# Patient Record
Sex: Female | Born: 1950 | Race: White | Hispanic: No | Marital: Married
Health system: Southern US, Community
[De-identification: ages and names within clinical notes are randomized; demographics above are authoritative.]

## PROBLEM LIST (undated history)

## (undated) DIAGNOSIS — F319 Bipolar disorder, unspecified: Secondary | ICD-10-CM

## (undated) DIAGNOSIS — E119 Type 2 diabetes mellitus without complications: Secondary | ICD-10-CM

## (undated) DIAGNOSIS — K219 Gastro-esophageal reflux disease without esophagitis: Secondary | ICD-10-CM

## (undated) HISTORY — DX: Type 2 diabetes mellitus without complications: E11.9

## (undated) HISTORY — DX: Gastro-esophageal reflux disease without esophagitis: K21.9

## (undated) HISTORY — DX: Bipolar disorder, unspecified: F31.9

---

## 2021-04-09 ENCOUNTER — Encounter (HOSPITAL_COMMUNITY): Payer: Self-pay | Admitting: Emergency Medicine

## 2021-04-09 ENCOUNTER — Emergency Department (HOSPITAL_COMMUNITY)
Admission: EM | Admit: 2021-04-09 | Discharge: 2021-04-10 | Disposition: A | Discharge status: Home / Self Care | Payer: Medicare Other | Attending: Emergency Medicine | Admitting: Emergency Medicine

## 2021-04-09 ENCOUNTER — Emergency Department (HOSPITAL_BASED_OUTPATIENT_CLINIC_OR_DEPARTMENT_OTHER): Payer: Medicare Other

## 2021-04-09 DIAGNOSIS — R0602 Shortness of breath: Secondary | ICD-10-CM | POA: Diagnosis not present

## 2021-04-09 DIAGNOSIS — M79605 Pain in left leg: Secondary | ICD-10-CM

## 2021-04-09 DIAGNOSIS — E119 Type 2 diabetes mellitus without complications: Secondary | ICD-10-CM | POA: Diagnosis not present

## 2021-04-09 DIAGNOSIS — R2242 Localized swelling, mass and lump, left lower limb: Secondary | ICD-10-CM | POA: Diagnosis not present

## 2021-04-09 DIAGNOSIS — Z20822 Contact with and (suspected) exposure to covid-19: Secondary | ICD-10-CM | POA: Insufficient documentation

## 2021-04-09 DIAGNOSIS — R609 Edema, unspecified: Secondary | ICD-10-CM

## 2021-04-09 NOTE — Progress Notes (Signed)
VASCULAR LAB    Left lower extremity venous duplex has been performed.  See CV proc for preliminary results.  Gave verbal report to triage nurse  Sherren Kerns, RVT 04/09/2021, 5:39 PM

## 2021-04-09 NOTE — ED Provider Notes (Signed)
Emergency Medicine Provider Triage Evaluation Note  Krista Roth , a 70 y.o. female  was evaluated in triage.  Pt complains of edema. Sent from [facility]. States she was told her left leg looked swollen today. Has some mild pain. Denies recent falls. Denies hx of DVT, PE. She is unsure if she is anticoagulation. She is not sure if she has hx of HF. Denies CP, COB  Review of Systems  Positive: Leg edema Negative: CP, SOB, falls  Physical Exam  BP (!) 173/91   Pulse 61   Temp 97.6 F (36.4 C) (Oral)   SpO2 98%  Gen:   Awake, no distress   Resp:  Normal effort  MSK:   Moves extremities without difficulty. Difficult exam. Tenderness to left leg. 1+Dp pulses bilaterally Other:  No pallor  Medical Decision Making  Medically screening exam initiated at 4:45 PM.  Appropriate orders placed.  Krista Roth was informed that the remainder of the evaluation will be completed by another provider, this initial triage assessment does not replace that evaluation, and the importance of remaining in the ED until their evaluation is complete.  Leg swelling   Keyshun Elpers A, PA-C 04/09/21 1647    Benjiman Core, MD 04/09/21 [phone]

## 2021-04-09 NOTE — ED Triage Notes (Signed)
Pt sent from [facility] for blood clot rule out. Left leg pain and swelling.

## 2021-04-10 ENCOUNTER — Emergency Department (HOSPITAL_COMMUNITY): Payer: Medicare Other

## 2021-04-10 DIAGNOSIS — M79605 Pain in left leg: Secondary | ICD-10-CM | POA: Diagnosis not present

## 2021-04-10 LAB — CBC WITH DIFFERENTIAL/PLATELET
Abs Immature Granulocytes: 0.01 10*3/uL (ref 0.00–0.07)
Basophils Absolute: 0 10*3/uL (ref 0.0–0.1)
Basophils Relative: 1 %
Eosinophils Absolute: 0.3 10*3/uL (ref 0.0–0.5)
Eosinophils Relative: 5 %
HCT: 43.5 % (ref 36.0–46.0)
Hemoglobin: 13.5 g/dL (ref 12.0–15.0)
Immature Granulocytes: 0 %
Lymphocytes Relative: 30 %
Lymphs Abs: 1.8 10*3/uL (ref 0.7–4.0)
MCH: 29.2 pg (ref 26.0–34.0)
MCHC: 31 g/dL (ref 30.0–36.0)
MCV: 94 fL (ref 80.0–100.0)
Monocytes Absolute: 0.5 10*3/uL (ref 0.1–1.0)
Monocytes Relative: 8 %
Neutro Abs: 3.4 10*3/uL (ref 1.7–7.7)
Neutrophils Relative %: 56 %
Platelets: 269 10*3/uL (ref 150–400)
RBC: 4.63 MIL/uL (ref 3.87–5.11)
RDW: 13.7 % (ref 11.5–15.5)
WBC: 6.1 10*3/uL (ref 4.0–10.5)
nRBC: 0 % (ref 0.0–0.2)

## 2021-04-10 LAB — BASIC METABOLIC PANEL
Anion gap: 8 (ref 5–15)
BUN: 27 mg/dL — ABNORMAL HIGH (ref 8–23)
CO2: 24 mmol/L (ref 22–32)
Calcium: 9.5 mg/dL (ref 8.9–10.3)
Chloride: 105 mmol/L (ref 98–111)
Creatinine, Ser: 1.66 mg/dL — ABNORMAL HIGH (ref 0.44–1.00)
GFR, Estimated: 33 mL/min — ABNORMAL LOW (ref >60)
Glucose, Bld: 134 mg/dL — ABNORMAL HIGH (ref 70–99)
Potassium: 4.6 mmol/L (ref 3.5–5.1)
Sodium: 137 mmol/L (ref 135–145)

## 2021-04-10 LAB — RESP PANEL BY RT-PCR (FLU A&B, COVID) ARPGX2
Influenza A by PCR: NEGATIVE
Influenza B by PCR: NEGATIVE
SARS Coronavirus 2 by RT PCR: NEGATIVE

## 2021-04-10 NOTE — ED Notes (Signed)
[facility] called  

## 2021-04-10 NOTE — ED Notes (Signed)
Attempted to call report to the facility. No answer.  

## 2021-04-10 NOTE — Discharge Instructions (Addendum)
Your leg did not show a DVT.  The x-ray of the leg also look normal.  There is some bruising which I suspect is causing some discomfort and swelling.  You may apply warm compress as needed.  Lab work here is reassuring.  COVID and flu test were negative.

## 2021-04-10 NOTE — ED Notes (Signed)
Discharge instructions reviewed with facility. Pt left via [facility].

## 2021-04-10 NOTE — ED Provider Notes (Signed)
Emergency Department Provider Note   I have reviewed the triage vital signs and the nursing notes.   HISTORY  Chief Complaint Leg Pain   HPI Krista Roth is a 70 y.o. female with past history reviewed below presents to the emergency department from the [facility] with left leg swelling and pain along with some intermittent shortness of breath.  Staff reported that her left leg looks somewhat swollen.  Patient denies any falls or other obvious injury.  She describes some intermittent shortness of breath that has been ongoing for some time.  Denies chest pain.  No fevers or chills. No radiation of symptoms or modifying factors.   Past Medical History:  Diagnosis Date   Bipolar 1 disorder (HCC)    Diabetes mellitus without complication (HCC)    GERD (gastroesophageal reflux disease)     There are no problems to display for this patient.   Allergies Patient has no known allergies.  No family history on file.  Social History    Review of Systems  Constitutional: No fever/chills Eyes: No visual changes. ENT: No sore throat. Cardiovascular: Denies chest pain. Respiratory: Intermittent shortness of breath. Gastrointestinal: No abdominal pain.  No nausea, no vomiting.  No diarrhea.  No constipation. Genitourinary: Negative for dysuria. Musculoskeletal: Negative for back pain. Positive left knee/leg pain. Skin: Negative for rash. Neurological: Negative for headaches, focal weakness or numbness.  10-point ROS otherwise negative.  ____________________________________________   PHYSICAL EXAM:  VITAL SIGNS: ED Triage Vitals  Enc Vitals Group     BP 04/09/21 1638 (!) 173/91     Pulse Rate 04/09/21 1638 61     Resp 04/09/21 1930 15     Temp 04/09/21 1638 97.6 F (36.4 C)     Temp Source 04/09/21 1638 Oral     SpO2 04/09/21 1638 98 %   Constitutional: Alert and oriented. Well appearing and in no acute distress. Eyes: Conjunctivae are normal.  Head:  Atraumatic. Nose: No congestion/rhinnorhea. Mouth/Throat: Mucous membranes are moist.  Neck: No stridor.  Cardiovascular: Normal rate, regular rhythm. Good peripheral circulation. Grossly normal heart sounds.   Respiratory: Normal respiratory effort.  No retractions. Lungs CTAB. Gastrointestinal: Soft and nontender. No distention.  Musculoskeletal: No appreciable unilateral leg swelling.  There is some bruising to the left shin and knee anteriorly.  No laceration.  Neurologic:  Normal speech and language. No gross focal neurologic deficits are appreciated.  Skin:  Skin is warm and dry. Anterior bruising to the left knee/shin.   ____________________________________________   LABS (all labs ordered are listed, but only abnormal results are displayed)  Labs Reviewed  BASIC METABOLIC PANEL - Abnormal; Notable for the following components:      Result Value   Glucose, Bld 134 (*)    BUN 27 (*)    Creatinine, Ser 1.66 (*)    GFR, Estimated 33 (*)    All other components within normal limits  RESP PANEL BY RT-PCR (FLU A&B, COVID) ARPGX2  CBC WITH DIFFERENTIAL/PLATELET   ____________________________________________  RADIOLOGY  VAS [place] LOWER EXTREMITY VENOUS (DVT) (ONLY [facility] & [facility])  Result Date: 04/09/2021  Lower Venous DVT Study Patient Name:  Krista Roth  Date of Exam:   04/09/2021 Medical Rec #: [id number]           Accession #:    [id number] Date of Birth: 08-21-1951           Patient Gender: F Patient Age:   70Y [facility]am [facility] Procedure:      VAS [place] LOWER EXTREMITY VENOUS (DVT) Referring Phys: [id number] BRITNI A HENDERLY --------------------------------------------------------------------------------  Indications: Edema, and Recent fall. Bruising to knee and calf.  Limitations: Body habitus and poor ultrasound/tissue interface. Comparison Study: No prior study on file Performing Technologist: Sherren Kerns RVS  Examination Guidelines: A complete evaluation includes  B-mode imaging, spectral Doppler, color Doppler, and power Doppler as needed of all accessible portions of each vessel. Bilateral testing is considered an integral part of a complete examination. Limited examinations for reoccurring indications may be performed as noted. The reflux portion of the exam is performed with the patient in reverse Trendelenburg.  +-----+---------------+---------+-----------+----------+--------------+ RIGHTCompressibilityPhasicitySpontaneityPropertiesThrombus Aging +-----+---------------+---------+-----------+----------+--------------+ CFV  Full           Yes      Yes                                 +-----+---------------+---------+-----------+----------+--------------+   +---------+---------------+---------+-----------+----------+-------------------+ LEFT     CompressibilityPhasicitySpontaneityPropertiesThrombus Aging      +---------+---------------+---------+-----------+----------+-------------------+ CFV      Full           Yes      Yes                                      +---------+---------------+---------+-----------+----------+-------------------+ SFJ      Full                                                             +---------+---------------+---------+-----------+----------+-------------------+ FV Prox  Full                                                             +---------+---------------+---------+-----------+----------+-------------------+ FV Mid   Full                                                             +---------+---------------+---------+-----------+----------+-------------------+ FV DistalFull                                                             +---------+---------------+---------+-----------+----------+-------------------+ PFV      Full                                                             +---------+---------------+---------+-----------+----------+-------------------+ POP       Full           Yes  Yes                                      +---------+---------------+---------+-----------+----------+-------------------+ PTV                                                   Not well visualized +---------+---------------+---------+-----------+----------+-------------------+ PERO                                                  Not well visualized +---------+---------------+---------+-----------+----------+-------------------+   Right common femoral vein patent. No evidence of deep venous thrombosis of left lower extremity, but posterior tibial and peroneal veins not well visualized by ultrasonographer.  *See table(s) above for measurements and observations. Electronically signed by Heath Lark on 04/09/2021 at 7:19:20 PM. Report was modified by Heath Lark on 04/09/2021 7:19:34 PM due to error.    Final (Updated)     ____________________________________________   PROCEDURES  Procedure(s) performed:   Procedures  None ____________________________________________   INITIAL IMPRESSION / ASSESSMENT AND PLAN / ED COURSE  Pertinent labs & imaging results that were available during my care of the patient were reviewed by me and considered in my medical decision making (see chart for details).   Patient presents emergency department with concern for left leg swelling and pain with some intermittent shortness of breath.  Shortness of breath symptoms seem more chronic.  She had an ultrasound of the left lower extremity today after MSE which shows no evidence of deep vein thrombosis in the left.  Patient does have some bruising on exam which I suspect is the area of discomfort and possibly swelling.  Plan for plain films of the area.  We will also obtain chest x-ray along with screening blood work and BNP. Doubt PE clinically.   Plain films negative. Plan for symptom mgmt and PCP follow up. Stable for discharge.   ____________________________________________  FINAL CLINICAL IMPRESSION(S) / ED DIAGNOSES  Final diagnoses:  Left leg pain      Note:  This document was prepared using Dragon voice recognition software and may include unintentional dictation errors.  Alona Bene, MD, [facility] Emergency Medicine    Yocelyn Brocious, Arlyss Repress, MD 04/11/21 [phone]

## 2021-08-30 DEATH — deceased

## 2022-08-26 IMAGING — CR DG CHEST 2V
2 series · 2 of 2 positions shown · non-contrast
Comparison: None available

CLINICAL DATA: Intermittent shortness of breath

EXAM:
CHEST - 2 VIEW

[chest lat]
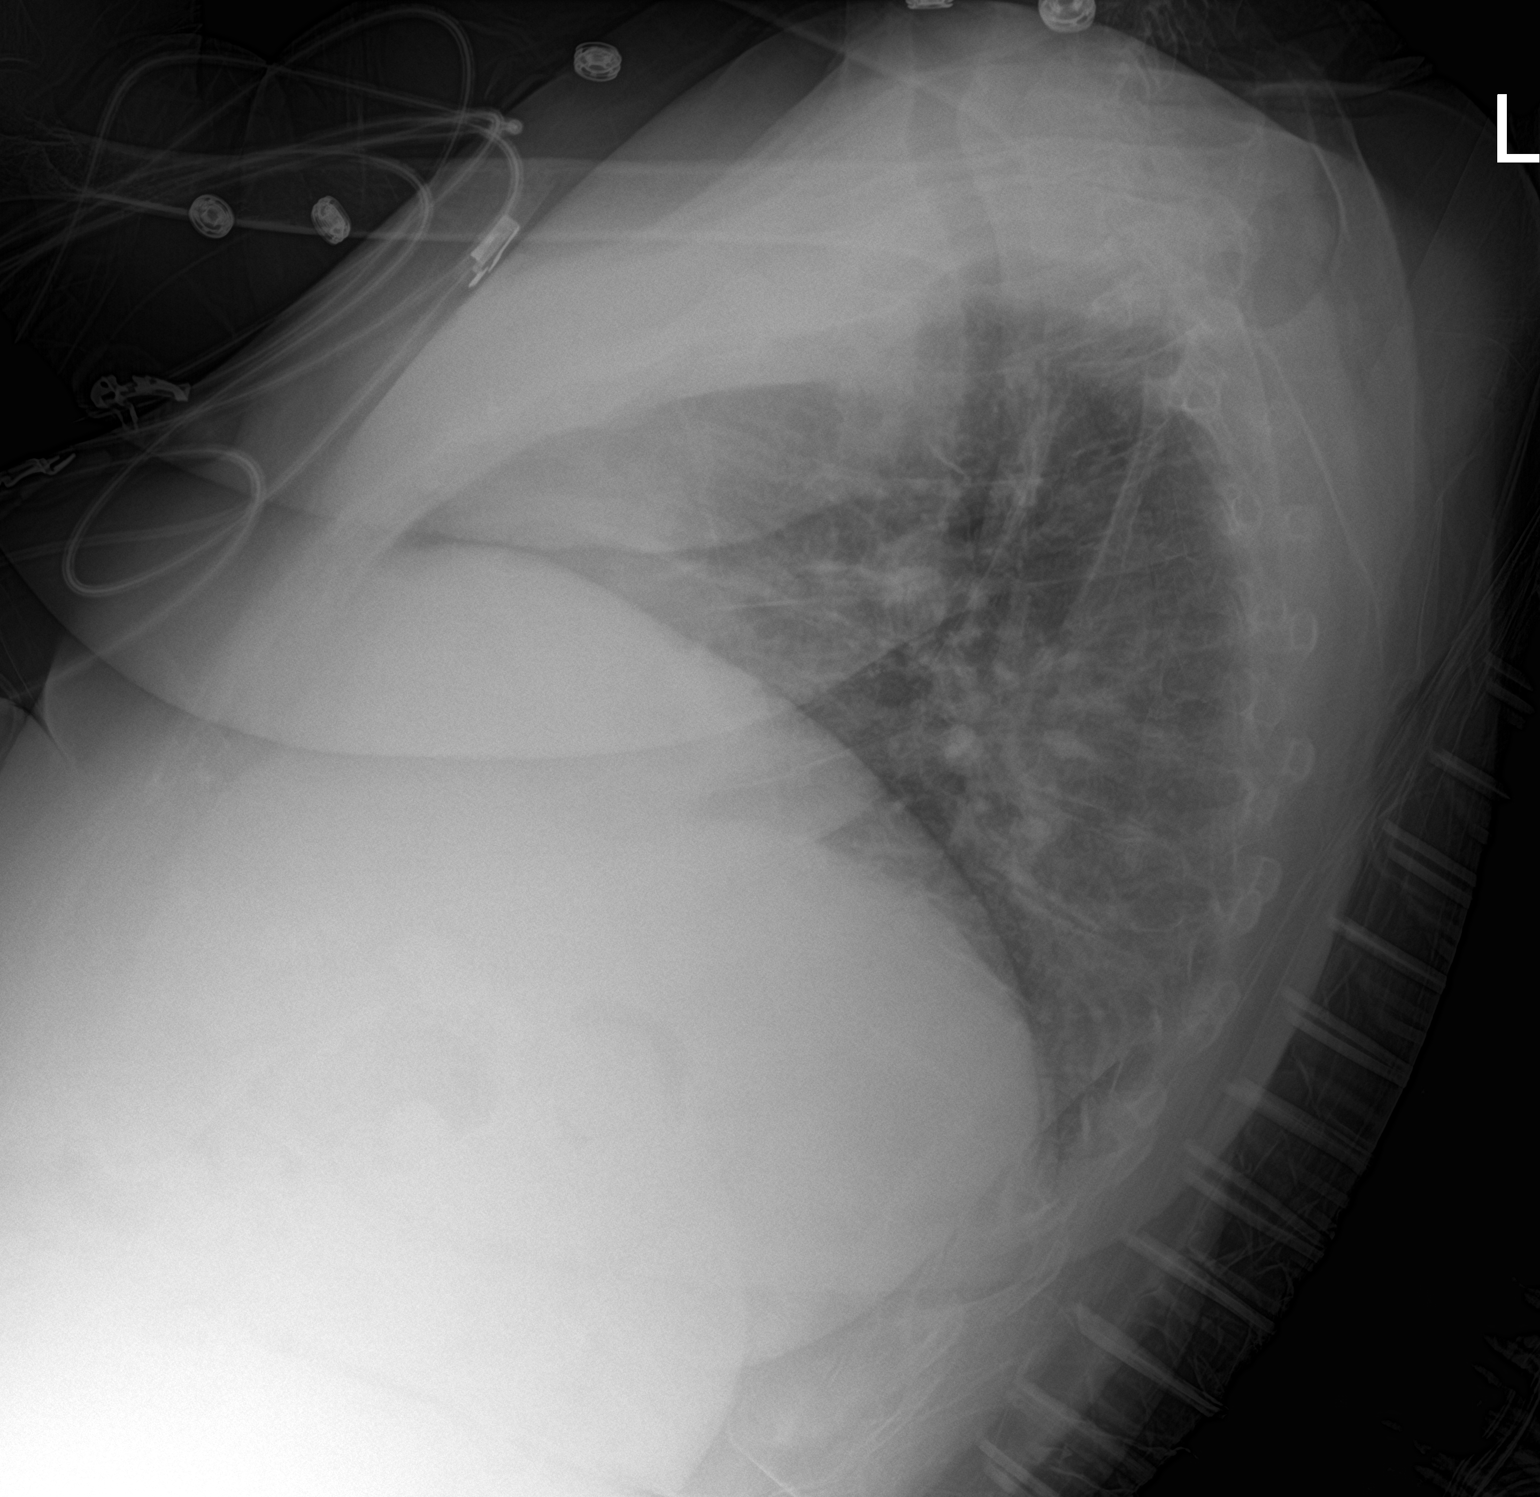

[chest ap]
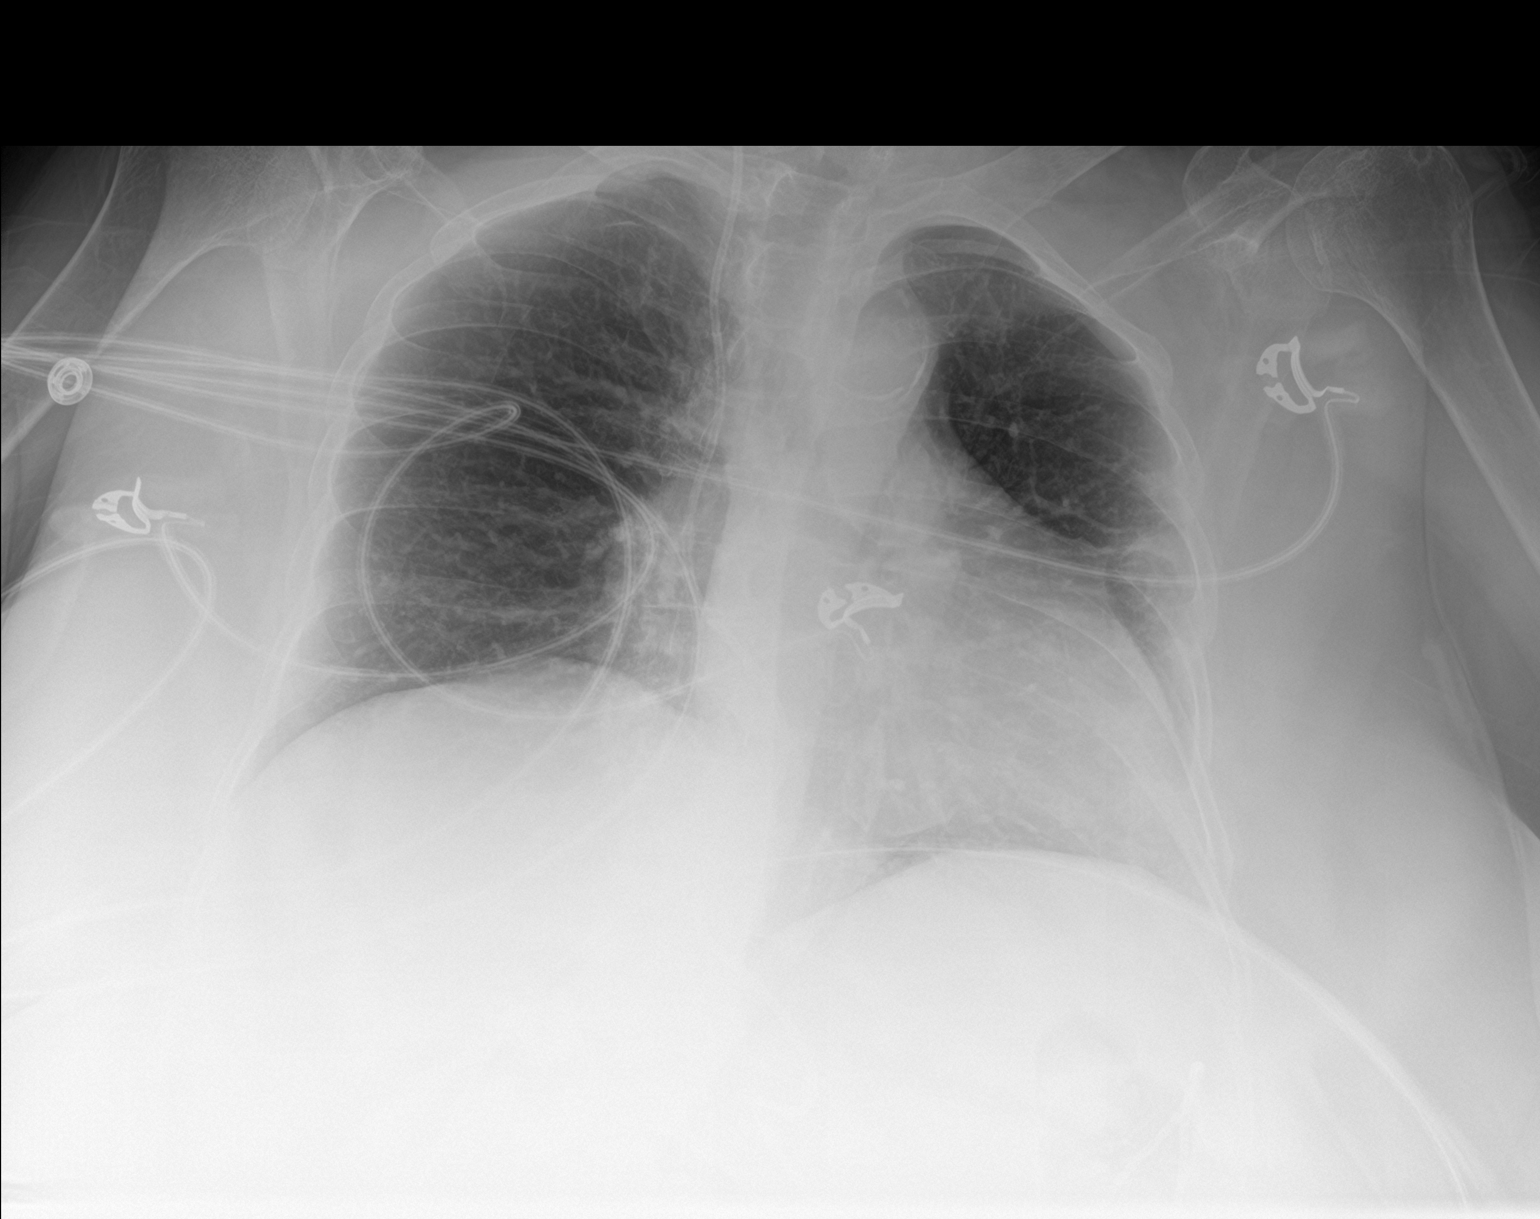

[2 of 2 positions shown; findings below may reference images not displayed]

FINDINGS: Borderline heart size accentuated by technique. Stable aortic and
hilar contours. Presumed VP shunt that traverses the right chest.
Low volume chest. There is no edema, consolidation, effusion, or
pneumothorax. Minimal scarring suspected in the peripheral left mid
lung. Artifact from EKG leads.
IMPRESSION: Low volume chest without acute finding.
